# Patient Record
Sex: Female | Born: 1995 | Race: Black or African American | Hispanic: No | Marital: Single | Smoking: Never smoker
Health system: Southern US, Community
[De-identification: ages and names within clinical notes are randomized; demographics above are authoritative.]

## PROBLEM LIST (undated history)

## (undated) DIAGNOSIS — T7803XA Anaphylactic reaction due to other fish, initial encounter: Secondary | ICD-10-CM

## (undated) DIAGNOSIS — J45909 Unspecified asthma, uncomplicated: Secondary | ICD-10-CM

## (undated) HISTORY — PX: FRACTURE SURGERY: SHX138

---

## 2018-07-11 ENCOUNTER — Emergency Department (HOSPITAL_COMMUNITY)
Admission: EM | Admit: 2018-07-11 | Discharge: 2018-07-11 | Disposition: A | Discharge status: Home / Self Care | Payer: Self-pay | Attending: Emergency Medicine | Admitting: Emergency Medicine

## 2018-07-11 ENCOUNTER — Emergency Department (HOSPITAL_COMMUNITY): Payer: Self-pay

## 2018-07-11 ENCOUNTER — Encounter (HOSPITAL_COMMUNITY): Payer: Self-pay

## 2018-07-11 ENCOUNTER — Other Ambulatory Visit: Payer: Self-pay

## 2018-07-11 DIAGNOSIS — S51831A Puncture wound without foreign body of right forearm, initial encounter: Secondary | ICD-10-CM | POA: Insufficient documentation

## 2018-07-11 DIAGNOSIS — W540XXA Bitten by dog, initial encounter: Secondary | ICD-10-CM | POA: Insufficient documentation

## 2018-07-11 DIAGNOSIS — Y999 Unspecified external cause status: Secondary | ICD-10-CM | POA: Insufficient documentation

## 2018-07-11 DIAGNOSIS — J45909 Unspecified asthma, uncomplicated: Secondary | ICD-10-CM | POA: Insufficient documentation

## 2018-07-11 DIAGNOSIS — Y9389 Activity, other specified: Secondary | ICD-10-CM | POA: Insufficient documentation

## 2018-07-11 DIAGNOSIS — Y929 Unspecified place or not applicable: Secondary | ICD-10-CM | POA: Insufficient documentation

## 2018-07-11 HISTORY — DX: Anaphylactic reaction due to other fish, initial encounter: T78.03XA

## 2018-07-11 HISTORY — DX: Unspecified asthma, uncomplicated: J45.909

## 2018-07-11 MED ORDER — BACITRACIN ZINC 500 UNIT/GM EX OINT
TOPICAL_OINTMENT | Freq: Once | CUTANEOUS | Status: AC
  Administered 2018-07-11: 19:00:00 via TOPICAL
  Filled 2018-07-11: qty 0.9

## 2018-07-11 MED ORDER — NAPROXEN 500 MG PO TABS: 500.0000 mg | ORAL_TABLET | Freq: Two times a day (BID) | ORAL | 0 refills | Status: AC

## 2018-07-11 MED ORDER — AMOXICILLIN-POT CLAVULANATE 875-125 MG PO TABS: 1.0000 | ORAL_TABLET | Freq: Two times a day (BID) | ORAL | 0 refills | Status: AC

## 2018-07-11 NOTE — ED Triage Notes (Signed)
Pt was bitten by her dog on the right upper forearm, who is up to date on vaccines.
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
## 2018-07-11 NOTE — Discharge Instructions (Addendum)
You are seen in the emergency department today after a dog bite.  Your x-ray was normal.  We are sending you home with Augmentin, an antibiotic.  As well as naproxen, and anti-inflammatory. - Naproxen is a nonsteroidal anti-inflammatory medication that will help with pain and swelling. Be sure to take this medication as prescribed with food, 1 pill every 12 hours,  It should be taken with food, as it can cause stomach upset, and more seriously, stomach bleeding. Do not take other nonsteroidal anti-inflammatory medications with this such as Advil, Motrin, Aleve, Mobic, Goodie Powder, or Motrin.    You make take Tylenol per over the counter dosing with these medications.   We have prescribed you new medication(s) today. Discuss the medications prescribed today with your pharmacist as they can have adverse effects and interactions with your other medicines including over the counter and prescribed medications. Seek medical evaluation if you start to experience new or abnormal symptoms after taking one of these medicines, seek care immediately if you start to experience difficulty breathing, feeling of your throat closing, facial swelling, or rash as these could be indications of a more serious allergic reaction   Please keep the wounds clean and dry as best possible.  You may apply over-the-counter antibiotic ointment per over-the-counter dosing.  Follow-up with primary care within 1 week for reevaluation of the wounds.  Return to the ER for new or worsening symptoms including but not limited to redness, pus draining from the wounds, fever, chills, or any other concerns.

## 2019-03-14 ENCOUNTER — Telehealth: Payer: Medicaid Other | Admitting: Emergency Medicine

## 2019-03-14 DIAGNOSIS — J069 Acute upper respiratory infection, unspecified: Secondary | ICD-10-CM

## 2019-03-14 NOTE — Progress Notes (Signed)
  E-Visit for [employer] Virus Screening  Based on what you have shared with me, you need to seek an evaluation for a severe illness that is causing your symptoms which may be coronavirus or some other illness. I recommend that you be seen and evaluated "face to face".   Because you are having shortness of breath, you need to be seen in person.  You will need to have your oxygen level assessed with a monitor and may need an x-ray and/or additional tests.  I recommend that you be seen in person at one of our emergency departments.  Our Emergency Departments are best equipped to handle patients with severe symptoms.  You will be evaluated by the ER provider (or higher level of care provider) who will determine whether you need formal testing.  If you are having a true medical emergency please call 911.   I recommend the following:  . [facility] Emergency Department [place], [place], [place] [address] [phone]  . [facility] Emergency Department [place], [place], [place] [address] [phone]  . [facility] Emergency Department [place], [place], [place] [address] [phone]  . [facility] Emergency Department [address] [place], [place], [place] [address] [phone]  . [facility] Emergency Department [place], [place], [place] [address] [phone]  NOTE: If you entered your credit card information for this eVisit, you will not be charged. You may see a "hold" on your card for the $35 but that hold will drop off and you will not have a charge processed.   Your e-visit answers were reviewed by a board certified advanced clinical practitioner to complete your personal care plan.  Thank you for using e-Visits.  Approximately 5 minutes was used in reviewing the patient's chart, questionnaire, prescribing medications, and documentation.

## 2020-02-17 ENCOUNTER — Other Ambulatory Visit: Payer: Self-pay

## 2020-02-17 ENCOUNTER — Encounter (HOSPITAL_COMMUNITY): Payer: Self-pay | Admitting: Emergency Medicine

## 2020-02-17 ENCOUNTER — Emergency Department (HOSPITAL_COMMUNITY)
Admission: EM | Admit: 2020-02-17 | Discharge: 2020-02-17 | Disposition: A | Discharge status: Home / Self Care | Payer: Medicaid Other | Attending: Emergency Medicine | Admitting: Emergency Medicine

## 2020-02-17 DIAGNOSIS — R197 Diarrhea, unspecified: Secondary | ICD-10-CM

## 2020-02-17 DIAGNOSIS — R1012 Left upper quadrant pain: Secondary | ICD-10-CM | POA: Insufficient documentation

## 2020-02-17 DIAGNOSIS — R110 Nausea: Secondary | ICD-10-CM | POA: Insufficient documentation

## 2020-02-17 LAB — CBC
HCT: 40.9 % (ref 36.0–46.0)
Hemoglobin: 13.3 g/dL (ref 12.0–15.0)
MCH: 29.4 pg (ref 26.0–34.0)
MCHC: 32.5 g/dL (ref 30.0–36.0)
MCV: 90.3 fL (ref 80.0–100.0)
Platelets: 340 10*3/uL (ref 150–400)
RBC: 4.53 MIL/uL (ref 3.87–5.11)
RDW: 11.9 % (ref 11.5–15.5)
WBC: 7.7 10*3/uL (ref 4.0–10.5)
nRBC: 0 % (ref 0.0–0.2)

## 2020-02-17 LAB — URINALYSIS, MICROSCOPIC (REFLEX): Bacteria, UA: NONE SEEN

## 2020-02-17 LAB — COMPREHENSIVE METABOLIC PANEL
ALT: 19 U/L (ref 0–44)
AST: 20 U/L (ref 15–41)
Albumin: 3.6 g/dL (ref 3.5–5.0)
Alkaline Phosphatase: 70 U/L (ref 38–126)
Anion gap: 10 (ref 5–15)
BUN: 11 mg/dL (ref 6–20)
CO2: 24 mmol/L (ref 22–32)
Calcium: 9.8 mg/dL (ref 8.9–10.3)
Chloride: 104 mmol/L (ref 98–111)
Creatinine, Ser: 0.94 mg/dL (ref 0.44–1.00)
GFR, Estimated: >60 mL/min (ref >60)
Glucose, Bld: 89 mg/dL (ref 70–99)
Potassium: 4.2 mmol/L (ref 3.5–5.1)
Sodium: 138 mmol/L (ref 135–145)
Total Bilirubin: 0.5 mg/dL (ref 0.3–1.2)
Total Protein: 7.3 g/dL (ref 6.5–8.1)

## 2020-02-17 LAB — URINALYSIS, ROUTINE W REFLEX MICROSCOPIC
Bilirubin Urine: NEGATIVE
Glucose, UA: NEGATIVE mg/dL
Ketones, ur: NEGATIVE mg/dL
Leukocytes,Ua: NEGATIVE
Nitrite: NEGATIVE
Protein, ur: NEGATIVE mg/dL
Specific Gravity, Urine: >1.03 — ABNORMAL HIGH (ref 1.005–1.030)
pH: 6 (ref 5.0–8.0)

## 2020-02-17 LAB — I-STAT BETA HCG BLOOD, ED (MC, WL, AP ONLY): I-stat hCG, quantitative: <5 m[IU]/mL (ref <5)

## 2020-02-17 LAB — LIPASE, BLOOD: Lipase: 65 U/L — ABNORMAL HIGH (ref 11–51)

## 2020-02-17 MED ORDER — DICYCLOMINE HCL 10 MG PO CAPS: 10.0000 mg | ORAL_CAPSULE | Freq: Three times a day (TID) | ORAL | 0 refills | Status: AC | PRN

## 2020-02-17 MED ORDER — ONDANSETRON 4 MG PO TBDP
4.0000 mg | ORAL_TABLET | Freq: Once | ORAL | Status: AC
  Administered 2020-02-17: 4 mg via ORAL
  Filled 2020-02-17: qty 1

## 2020-02-17 MED ORDER — ONDANSETRON 4 MG PO TBDP: 4.0000 mg | ORAL_TABLET | Freq: Three times a day (TID) | ORAL | 0 refills | Status: AC | PRN

## 2020-02-17 MED ORDER — DICYCLOMINE HCL 10 MG PO CAPS
10.0000 mg | ORAL_CAPSULE | Freq: Once | ORAL | Status: AC
  Administered 2020-02-17: 10 mg via ORAL
  Filled 2020-02-17: qty 1

## 2020-02-17 NOTE — ED Triage Notes (Signed)
Patient c/o generalized abdominal pain with nausea and diarrhea since last night.
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
## 2020-04-12 IMAGING — CR RIGHT FOREARM - 2 VIEW
2 series · 2 of 2 positions shown · non-contrast
Comparison: None

CLINICAL DATA: Bitten by her dog in the RIGHT upper forearm today,
small puncture wounds

EXAM:
RIGHT FOREARM - 2 VIEW

[x forearm ap right]
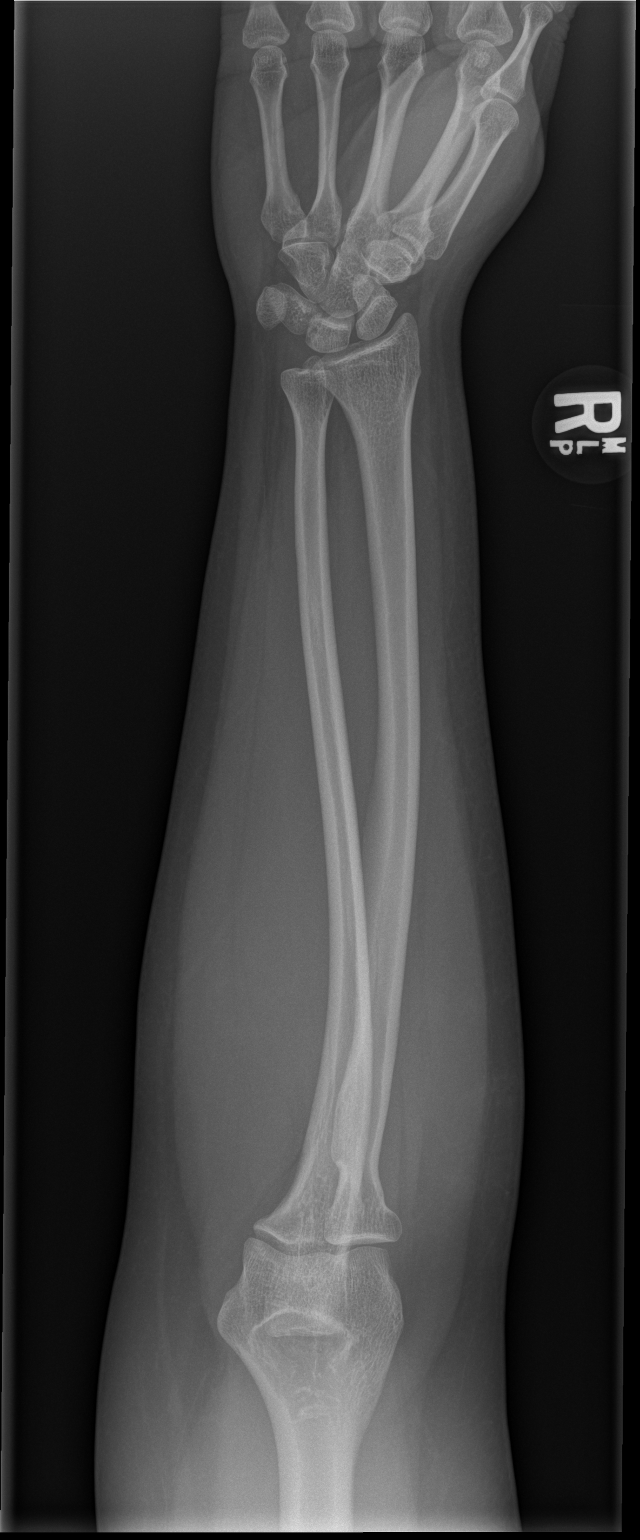

[x forearm lat right]
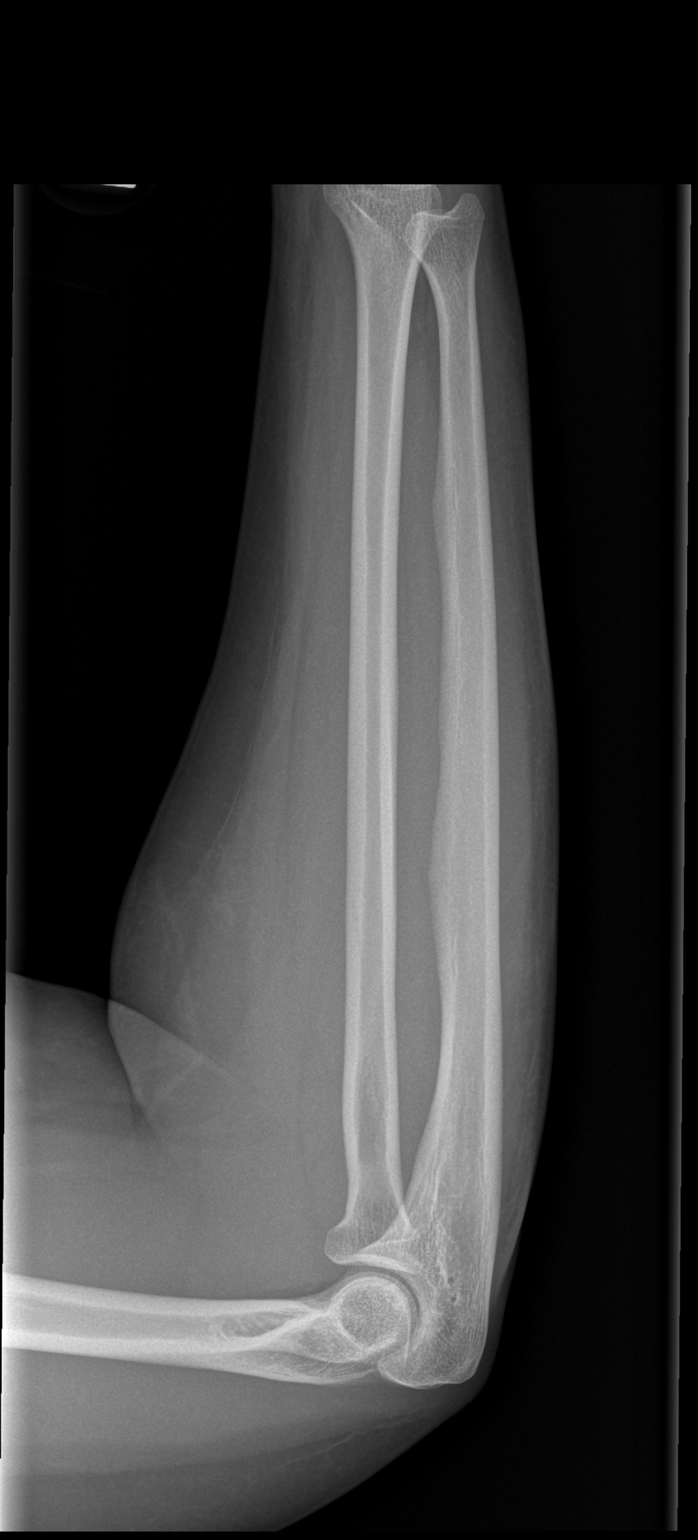

[2 of 2 positions shown; findings below may reference images not displayed]

FINDINGS: Osseous mineralization normal.

Wrist and elbow joint alignments normal.

No acute fracture, dislocation or bone destruction.

No radiopaque foreign bodies or soft tissue gas identified.
IMPRESSION: Normal exam.

## 2020-09-19 ENCOUNTER — Encounter (HOSPITAL_COMMUNITY): Payer: Self-pay | Admitting: Emergency Medicine

## 2020-09-19 ENCOUNTER — Other Ambulatory Visit: Payer: Self-pay

## 2020-09-19 ENCOUNTER — Emergency Department (HOSPITAL_COMMUNITY)
Admission: EM | Admit: 2020-09-19 | Discharge: 2020-09-19 | Disposition: A | Discharge status: Home / Self Care | Payer: 59 | Attending: Emergency Medicine | Admitting: Emergency Medicine

## 2020-09-19 DIAGNOSIS — W57XXXA Bitten or stung by nonvenomous insect and other nonvenomous arthropods, initial encounter: Secondary | ICD-10-CM | POA: Diagnosis not present

## 2020-09-19 DIAGNOSIS — S70361A Insect bite (nonvenomous), right thigh, initial encounter: Secondary | ICD-10-CM | POA: Insufficient documentation

## 2020-09-19 DIAGNOSIS — J45909 Unspecified asthma, uncomplicated: Secondary | ICD-10-CM | POA: Diagnosis not present

## 2020-09-19 MED ORDER — DOXYCYCLINE HYCLATE 100 MG PO TABS
100.0000 mg | ORAL_TABLET | Freq: Once | ORAL | Status: AC
  Administered 2020-09-19: 100 mg via ORAL
  Filled 2020-09-19: qty 1

## 2020-09-19 MED ORDER — DOXYCYCLINE HYCLATE 100 MG PO CAPS: 100.0000 mg | ORAL_CAPSULE | Freq: Two times a day (BID) | ORAL | 0 refills | Status: AC

## 2020-09-19 NOTE — Discharge Instructions (Addendum)
Take the prescribed medication as directed.  Keep area clean with soap and warm water. Follow-up with your primary care doctor. Return to the ED for new or worsening symptoms.
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
## 2020-09-19 NOTE — ED Triage Notes (Signed)
Pt reports she found a bug bite to her inner right thigh.  She tried to remove it but does not know if she got the whole thing.

## 2021-03-02 ENCOUNTER — Ambulatory Visit: Payer: 59

## 2022-02-03 ENCOUNTER — Emergency Department (HOSPITAL_COMMUNITY)
Admission: EM | Admit: 2022-02-03 | Discharge: 2022-02-03 | Disposition: A | Discharge status: Home / Self Care | Payer: BLUE CROSS/BLUE SHIELD | Attending: Emergency Medicine | Admitting: Emergency Medicine

## 2022-02-03 ENCOUNTER — Emergency Department (HOSPITAL_COMMUNITY): Payer: BLUE CROSS/BLUE SHIELD

## 2022-02-03 ENCOUNTER — Encounter (HOSPITAL_COMMUNITY): Payer: Self-pay

## 2022-02-03 DIAGNOSIS — M542 Cervicalgia: Secondary | ICD-10-CM | POA: Diagnosis not present

## 2022-02-03 DIAGNOSIS — H53142 Visual discomfort, left eye: Secondary | ICD-10-CM | POA: Insufficient documentation

## 2022-02-03 DIAGNOSIS — R519 Headache, unspecified: Secondary | ICD-10-CM | POA: Diagnosis present

## 2022-02-03 DIAGNOSIS — G43809 Other migraine, not intractable, without status migrainosus: Secondary | ICD-10-CM | POA: Diagnosis not present

## 2022-02-03 DIAGNOSIS — J45909 Unspecified asthma, uncomplicated: Secondary | ICD-10-CM | POA: Insufficient documentation

## 2022-02-03 LAB — URINALYSIS, ROUTINE W REFLEX MICROSCOPIC
Bilirubin Urine: NEGATIVE
Glucose, UA: NEGATIVE mg/dL
Ketones, ur: NEGATIVE mg/dL
Nitrite: NEGATIVE
Protein, ur: NEGATIVE mg/dL
Specific Gravity, Urine: 1.023 (ref 1.005–1.030)
pH: 5 (ref 5.0–8.0)

## 2022-02-03 LAB — COMPREHENSIVE METABOLIC PANEL
ALT: 20 U/L (ref 0–44)
AST: 20 U/L (ref 15–41)
Albumin: 3.2 g/dL — ABNORMAL LOW (ref 3.5–5.0)
Alkaline Phosphatase: 49 U/L (ref 38–126)
Anion gap: 4 — ABNORMAL LOW (ref 5–15)
BUN: 10 mg/dL (ref 6–20)
CO2: 25 mmol/L (ref 22–32)
Calcium: 8.7 mg/dL — ABNORMAL LOW (ref 8.9–10.3)
Chloride: 106 mmol/L (ref 98–111)
Creatinine, Ser: 0.95 mg/dL (ref 0.44–1.00)
GFR, Estimated: >60 mL/min (ref >60)
Glucose, Bld: 99 mg/dL (ref 70–99)
Potassium: 4.2 mmol/L (ref 3.5–5.1)
Sodium: 135 mmol/L (ref 135–145)
Total Bilirubin: 0.4 mg/dL (ref 0.3–1.2)
Total Protein: 6.4 g/dL — ABNORMAL LOW (ref 6.5–8.1)

## 2022-02-03 LAB — CBC
HCT: 38.7 % (ref 36.0–46.0)
Hemoglobin: 11.9 g/dL — ABNORMAL LOW (ref 12.0–15.0)
MCH: 27.8 pg (ref 26.0–34.0)
MCHC: 30.7 g/dL (ref 30.0–36.0)
MCV: 90.4 fL (ref 80.0–100.0)
Platelets: 327 10*3/uL (ref 150–400)
RBC: 4.28 MIL/uL (ref 3.87–5.11)
RDW: 13.5 % (ref 11.5–15.5)
WBC: 7.3 10*3/uL (ref 4.0–10.5)
nRBC: 0 % (ref 0.0–0.2)

## 2022-02-03 LAB — I-STAT BETA HCG BLOOD, ED (MC, WL, AP ONLY): I-stat hCG, quantitative: <5 m[IU]/mL (ref <5)

## 2022-02-03 MED ORDER — PROCHLORPERAZINE EDISYLATE 10 MG/2ML IJ SOLN
10.0000 mg | Freq: Once | INTRAMUSCULAR | Status: AC
  Administered 2022-02-03: 10 mg via INTRAVENOUS
  Filled 2022-02-03: qty 2

## 2022-02-03 MED ORDER — SODIUM CHLORIDE 0.9 % IV BOLUS
500.0000 mL | Freq: Once | INTRAVENOUS | Status: AC
  Administered 2022-02-03: 500 mL via INTRAVENOUS

## 2022-02-03 MED ORDER — KETOROLAC TROMETHAMINE 15 MG/ML IJ SOLN
15.0000 mg | Freq: Once | INTRAMUSCULAR | Status: AC
  Administered 2022-02-03: 15 mg via INTRAVENOUS
  Filled 2022-02-03: qty 1

## 2022-02-03 MED ORDER — IOHEXOL 350 MG/ML SOLN
80.0000 mL | Freq: Once | INTRAVENOUS | Status: AC | PRN
  Administered 2022-02-03: 80 mL via INTRAVENOUS

## 2022-02-03 MED ORDER — DIPHENHYDRAMINE HCL 50 MG/ML IJ SOLN
12.5000 mg | Freq: Once | INTRAMUSCULAR | Status: AC
  Administered 2022-02-03: 12.5 mg via INTRAVENOUS
  Filled 2022-02-03: qty 1

## 2022-02-03 MED ORDER — SODIUM CHLORIDE (PF) 0.9 % IJ SOLN
INTRAMUSCULAR | Status: AC
  Filled 2022-02-03: qty 50

## 2022-02-03 NOTE — ED Triage Notes (Signed)
Pt arrived via POV, c/o left sided neck pain and headache.

## 2022-02-03 NOTE — ED Provider Notes (Signed)
[facility]-EMERGENCY DEPT Provider Note   CSN: [id number] Arrival date & time: 02/03/22  0910     History  Chief Complaint  Patient presents with   Headache    Embree Vita Currin is a 26 y.o. female with history of asthma who presents to the emergency department complaining of left-sided neck pain and headache starting upon waking this morning.  Patient states that her neck feels very stiff, and its hurt enough to make her head hurt.  Has history of migraines, states this feels different compared to her prior.  States this also does not feel like a "crick in her neck", because it is more painful.  Intermittently having some muscle spasms.  Tried taking aspirin this morning without relief.  Also complaining of some sensitivity to light, no blurry vision.  No fever, chills, dizziness, nausea or vomiting.  No trauma to the neck.   Headache Associated symptoms: neck pain, neck stiffness and photophobia   Associated symptoms: no dizziness, no fever, no numbness and no weakness        Home Medications Prior to Admission medications   Medication Sig Start Date End Date Taking? Authorizing Provider  amoxicillin-clavulanate (AUGMENTIN) 875-125 MG tablet Take 1 tablet by mouth every 12 (twelve) hours. 07/11/18   Petrucelli, Samantha R, PA-C  dicyclomine (BENTYL) 10 MG capsule Take 1 capsule (10 mg total) by mouth 3 (three) times daily as needed for spasms. 02/17/20   Little, Ambrose Finland, MD  doxycycline (VIBRAMYCIN) 100 MG capsule Take 1 capsule (100 mg total) by mouth 2 (two) times daily. 09/19/20   Garlon Hatchet, PA-C  naproxen (NAPROSYN) 500 MG tablet Take 1 tablet (500 mg total) by mouth 2 (two) times daily. 07/11/18   Petrucelli, Samantha R, PA-C  ondansetron (ZOFRAN ODT) 4 MG disintegrating tablet Take 1 tablet (4 mg total) by mouth every 8 (eight) hours as needed for nausea or vomiting. 02/17/20   Little, Ambrose Finland, MD      Allergies    Shellfish-derived  products    Review of Systems   Review of Systems  Constitutional:  Negative for chills and fever.  Eyes:  Positive for photophobia. Negative for visual disturbance.  Musculoskeletal:  Positive for neck pain and neck stiffness.  Neurological:  Positive for headaches. Negative for dizziness, facial asymmetry, speech difficulty, weakness, light-headedness and numbness.  All other systems reviewed and are negative.   Physical Exam Updated Vital Signs BP 114/77 (BP Location: Left Arm)   Pulse 85   Temp 97.9 F (36.6 C) (Oral)   Resp 16   SpO2 99%  Physical Exam Vitals and nursing note reviewed.  Constitutional:      Appearance: Normal appearance.  HENT:     Head: Normocephalic and atraumatic.  Eyes:     General: Lids are normal.     Extraocular Movements: Extraocular movements intact.     Right eye: No nystagmus.     Left eye: No nystagmus.     Conjunctiva/sclera: Conjunctivae normal.     Pupils: Pupils are equal, round, and reactive to light.     Slit lamp exam:    Right eye: No photophobia.     Left eye: Photophobia present.  Neck:     Trachea: Trachea normal.     Meningeal: Brudzinski's sign and Kernig's sign absent.      Comments: Slightly limited cervical rotation to the right due to pain. No cervical midline spinal tenderness.  Cardiovascular:     Rate and Rhythm:  Normal rate and regular rhythm.  Pulmonary:     Effort: Pulmonary effort is normal. No respiratory distress.     Breath sounds: Normal breath sounds.  Abdominal:     General: There is no distension.     Palpations: Abdomen is soft.     Tenderness: There is no abdominal tenderness.  Musculoskeletal:     Cervical back: Pain with movement present. No spinous process tenderness. Decreased range of motion.  Lymphadenopathy:     Cervical: No cervical adenopathy.  Skin:    General: Skin is warm and dry.  Neurological:     General: No focal deficit present.     Mental Status: She is alert.     Comments:  Neuro: Speech is clear, able to follow commands. CN III-XII intact grossly intact. PERRLA. EOMI. Sensation intact throughout. Str 5/5 all extremities.     ED Results / Procedures / Treatments   Labs (all labs ordered are listed, but only abnormal results are displayed) Labs Reviewed  COMPREHENSIVE METABOLIC PANEL - Abnormal; Notable for the following components:      Result Value   Calcium 8.7 (*)    Total Protein 6.4 (*)    Albumin 3.2 (*)    Anion gap 4 (*)    All other components within normal limits  CBC - Abnormal; Notable for the following components:   Hemoglobin 11.9 (*)    All other components within normal limits  URINALYSIS, ROUTINE W REFLEX MICROSCOPIC - Abnormal; Notable for the following components:   APPearance CLOUDY (*)    Hgb urine dipstick SMALL (*)    Leukocytes,Ua TRACE (*)    Bacteria, UA RARE (*)    All other components within normal limits  I-STAT BETA HCG BLOOD, ED ([facility], [facility], [facility] ONLY)    EKG None  Radiology CT Angio Head W or Wo Contrast  Result Date: 02/03/2022 CLINICAL DATA:  Headache, sudden, severe; Vertebral artery aneurysm suspected EXAM: CT ANGIOGRAPHY HEAD AND NECK TECHNIQUE: Multidetector CT imaging of the head and neck was performed using the standard protocol during bolus administration of intravenous contrast. Multiplanar CT image reconstructions and MIPs were obtained to evaluate the vascular anatomy. Carotid stenosis measurements (when applicable) are obtained utilizing NASCET criteria, using the distal internal carotid diameter as the denominator. RADIATION DOSE REDUCTION: This exam was performed according to the departmental dose-optimization program which includes automated exposure control, adjustment of the mA and/or kV according to patient size and/or use of iterative reconstruction technique. CONTRAST:  40mL OMNIPAQUE IOHEXOL 350 MG/ML SOLN COMPARISON:  None Available. FINDINGS: CT HEAD FINDINGS Brain: No evidence of acute infarction,  hemorrhage, hydrocephalus, extra-axial collection or mass lesion/mass effect. Vascular: See below. Skull: No acute fracture. Sinuses/Orbits: No acute findings. Other: No mastoid effusions. Review of the MIP images confirms the above findings CTA NECK FINDINGS Aortic arch: Great vessel origins are patent without significant stenosis. Right carotid system: No evidence of dissection, stenosis (50% or greater), or occlusion. Left carotid system: No evidence of dissection, stenosis (50% or greater), or occlusion. Vertebral arteries: Mildly right dominant. No evidence of dissection, stenosis (50% or greater), or occlusion. Skeleton: No acute findings. Other neck: No acute findings. Upper chest: Visualized lung apices are clear. Review of the MIP images confirms the above findings CTA HEAD FINDINGS Anterior circulation: Bilateral intracranial ICAs, MCAs, and ACAs are patent without proximal hemodynamically significant stenosis. No aneurysm identified. Posterior circulation: Bilateral intradural vertebral arteries, basilar artery and bilateral posterior cerebral arteries are patent without proximal hemodynamically significant  stenosis. No aneurysm identified. Venous sinuses: As permitted by contrast timing, patent. Review of the MIP images confirms the above findings IMPRESSION: 1. No evidence of acute intracranial abnormality. 2. No large vessel occlusion or proximal hemodynamically significant stenosis. 3. No evidence of aneurysm. Electronically Signed   By: Feliberto Harts M.D.   On: 02/03/2022 13:57   CT Angio Neck W and/or Wo Contrast  Result Date: 02/03/2022 CLINICAL DATA:  Headache, sudden, severe; Vertebral artery aneurysm suspected EXAM: CT ANGIOGRAPHY HEAD AND NECK TECHNIQUE: Multidetector CT imaging of the head and neck was performed using the standard protocol during bolus administration of intravenous contrast. Multiplanar CT image reconstructions and MIPs were obtained to evaluate the vascular anatomy.  Carotid stenosis measurements (when applicable) are obtained utilizing NASCET criteria, using the distal internal carotid diameter as the denominator. RADIATION DOSE REDUCTION: This exam was performed according to the departmental dose-optimization program which includes automated exposure control, adjustment of the mA and/or kV according to patient size and/or use of iterative reconstruction technique. CONTRAST:  71mL OMNIPAQUE IOHEXOL 350 MG/ML SOLN COMPARISON:  None Available. FINDINGS: CT HEAD FINDINGS Brain: No evidence of acute infarction, hemorrhage, hydrocephalus, extra-axial collection or mass lesion/mass effect. Vascular: See below. Skull: No acute fracture. Sinuses/Orbits: No acute findings. Other: No mastoid effusions. Review of the MIP images confirms the above findings CTA NECK FINDINGS Aortic arch: Great vessel origins are patent without significant stenosis. Right carotid system: No evidence of dissection, stenosis (50% or greater), or occlusion. Left carotid system: No evidence of dissection, stenosis (50% or greater), or occlusion. Vertebral arteries: Mildly right dominant. No evidence of dissection, stenosis (50% or greater), or occlusion. Skeleton: No acute findings. Other neck: No acute findings. Upper chest: Visualized lung apices are clear. Review of the MIP images confirms the above findings CTA HEAD FINDINGS Anterior circulation: Bilateral intracranial ICAs, MCAs, and ACAs are patent without proximal hemodynamically significant stenosis. No aneurysm identified. Posterior circulation: Bilateral intradural vertebral arteries, basilar artery and bilateral posterior cerebral arteries are patent without proximal hemodynamically significant stenosis. No aneurysm identified. Venous sinuses: As permitted by contrast timing, patent. Review of the MIP images confirms the above findings IMPRESSION: 1. No evidence of acute intracranial abnormality. 2. No large vessel occlusion or proximal  hemodynamically significant stenosis. 3. No evidence of aneurysm. Electronically Signed   By: Feliberto Harts M.D.   On: 02/03/2022 13:57    Procedures Procedures    Medications Ordered in ED Medications  sodium chloride 0.9 % bolus 500 mL (500 mLs Intravenous New Bag/Given 02/03/22 1410)  ketorolac (TORADOL) 15 MG/ML injection 15 mg (15 mg Intravenous Given 02/03/22 1409)  diphenhydrAMINE (BENADRYL) injection 12.5 mg (12.5 mg Intravenous Given 02/03/22 1409)  prochlorperazine (COMPAZINE) injection 10 mg (10 mg Intravenous Given 02/03/22 1414)  iohexol (OMNIPAQUE) 350 MG/ML injection 80 mL (80 mLs Intravenous Contrast Given 02/03/22 1331)  sodium chloride (PF) 0.9 % injection (  Given 02/03/22 1414)    ED Course/ Medical Decision Making/ A&P                           Medical Decision Making Amount and/or Complexity of Data Reviewed Labs: ordered. Radiology: ordered.  This patient is a 25 y.o. female  who presents to the ED for concern of neck pain and headache.   Differential diagnoses prior to evaluation: The emergent differential diagnosis includes, but is not limited to,  Intracranial hemorrhage, meningitis, CVA, intracranial tumor, venous sinus thrombosis, migraine, cluster headache, hypertension,  drug related, pseudotumor cerebri, AVM, head injury, tension headache, sinusitis, dental abscess, otitis media, TMJ. This is not an exhaustive differential.   Past Medical History / Co-morbidities: Asthma  Additional history: Chart reviewed. Pertinent results include: Has been seen previously for intractable headaches, hx of similar as a child. Never had head imaging performed.   Physical Exam: Physical exam performed. The pertinent findings include: Slightly hypertensive, otherwise normal vital signs.  Left eye photophobia.  Normal neurologic exam as above.  Lab Tests/Imaging studies: I personally interpreted labs/imaging and the pertinent results include: CBC and CMP  unremarkable.  Pregnancy negative.  CT head and neck without acute abnormalities. I agree with the radiologist interpretation.  Medications: I ordered medication including migraine cocktail including IV fluids, toradol, benadryl, and compazine.  I have reviewed the patients home medicines and have made adjustments as needed. Reports headache and neck pain has resolved after medication.    Disposition: After consideration of the diagnostic results and the patients response to treatment, I feel that emergency department workup does not suggest an emergent condition requiring admission or immediate intervention beyond what has been performed at this time. The plan is: discharge to home with symptomatic management of any ongoing headache. The patient is safe for discharge and has been instructed to return immediately for worsening symptoms, change in symptoms or any other concerns.   I discussed this case with my attending physician Dr. Langston Masker who cosigned this note including patient's presenting symptoms, physical exam, and planned diagnostics and interventions. Attending physician stated agreement with plan or made changes to plan which were implemented.   Final Clinical Impression(s) / ED Diagnoses Final diagnoses:  Neck pain on left side  Other migraine without status migrainosus, not intractable    Rx / DC Orders ED Discharge Orders     None      Portions of this report may have been transcribed using voice recognition software. Every effort was made to ensure accuracy; however, inadvertent computerized transcription errors may be present.    Estill Cotta 02/03/22 1509    Wyvonnia Dusky, MD 02/03/22 [phone]

## 2022-02-03 NOTE — Discharge Instructions (Signed)
You were seen emergency department today for headache and neck pain.  As we discussed your lab work and imaging of your head and neck all look normal today.  We gave you medication for migraine, and I am glad that its relieved your symptoms.  Continue to monitor how you're doing and return to the ER for new or worsening symptoms.

## 2024-03-20 ENCOUNTER — Telehealth
